# Patient Record
Sex: Female | Born: 1985 | Marital: Married | Smoking: Never smoker
Health system: Southern US, Community
[De-identification: ages and names within clinical notes are randomized; demographics above are authoritative.]

## PROBLEM LIST (undated history)

## (undated) DIAGNOSIS — Z789 Other specified health status: Secondary | ICD-10-CM

## (undated) HISTORY — PX: NO PAST SURGERIES: SHX2092

---

## 2013-07-06 LAB — OB RESULTS CONSOLE GBS: GBS: NEGATIVE

## 2013-12-29 LAB — OB RESULTS CONSOLE RUBELLA ANTIBODY, IGM: Rubella: IMMUNE

## 2013-12-29 LAB — OB RESULTS CONSOLE GC/CHLAMYDIA
Chlamydia: NEGATIVE
GC PROBE AMP, GENITAL: NEGATIVE

## 2013-12-29 LAB — OB RESULTS CONSOLE ABO/RH: RH Type: POSITIVE

## 2013-12-29 LAB — OB RESULTS CONSOLE RPR: RPR: NONREACTIVE

## 2013-12-29 LAB — OB RESULTS CONSOLE VARICELLA ZOSTER ANTIBODY, IGG: Varicella: NON-IMMUNE/NOT IMMUNE

## 2013-12-29 LAB — OB RESULTS CONSOLE HIV ANTIBODY (ROUTINE TESTING): HIV: NONREACTIVE

## 2013-12-29 LAB — OB RESULTS CONSOLE HEPATITIS B SURFACE ANTIGEN: Hepatitis B Surface Ag: NEGATIVE

## 2014-02-27 NOTE — L&D Delivery Note (Signed)
Obstetrical Delivery Note   Date of Delivery:   07/30/2014 at 0845 Primary OB:   [facility] Gestational Age/EDD: 3872w4d  Antepartum complications: preeclampsia with severe features  Delivered By:   Farrel ConnersGUTIERREZ, Yajaira Doffing, CNM  Delivery Type:   spontaneous vaginal delivery  Procedure Details:   SVD of viable female infant in OA with short cord after a three hour second stage.Placed baby on mother's abdomen and cord clamped and cut after pulsations ceased. Baby skin to skin with mother. Placenta delivered spontaneously intact with 3 vessel cord. Uterine atony followed and bimanual, IV Pitocin bolus, and 800 mcg Cytotec helped with hemostasis. Foley reinserted to keep bladder deflated. Uterus explored x2 and clots removed from uterus. Kefzol 2 GM administered IV pp. EBL>55800ml. See laceration regarding repair Anesthesia:    epidural Intrapartum complications: preeclampsia with severe features including liver enzyme elevations and some blood pressures in the severe range. Was given apresoline in second stage x1 GBS:    negative Laceration:    Partial 3rd degree-repaired with 2-0 Vicryl, and second degree repaired with 2-0 and 3-0 Chromic in layers Episiotomy:    none Placenta:    Via active 3rd stage. To pathology: no Estimated Blood Loss:  >500 ml  Baby:    Liveborn female, Apgars 8/9, weight 6-10    Ceola Para, CNM

## 2014-07-13 ENCOUNTER — Inpatient Hospital Stay (HOSPITAL_COMMUNITY): Admission: AD | Admit: 2014-07-13 | Payer: Self-pay | Source: Ambulatory Visit | Admitting: Obstetrics & Gynecology

## 2014-07-28 ENCOUNTER — Inpatient Hospital Stay
Admission: EM | Admit: 2014-07-28 | Discharge: 2014-08-02 | DRG: 988 | Disposition: A | Discharge status: Home / Self Care | Payer: PRIVATE HEALTH INSURANCE | Attending: Obstetrics & Gynecology | Admitting: Obstetrics & Gynecology

## 2014-07-28 ENCOUNTER — Encounter: Payer: Self-pay | Admitting: *Deleted

## 2014-07-28 DIAGNOSIS — O139 Gestational [pregnancy-induced] hypertension without significant proteinuria, unspecified trimester: Secondary | ICD-10-CM | POA: Diagnosis present

## 2014-07-28 DIAGNOSIS — O1413 Severe pre-eclampsia, third trimester: Secondary | ICD-10-CM | POA: Diagnosis not present

## 2014-07-28 DIAGNOSIS — O133 Gestational [pregnancy-induced] hypertension without significant proteinuria, third trimester: Secondary | ICD-10-CM | POA: Diagnosis present

## 2014-07-28 DIAGNOSIS — Z3A37 37 weeks gestation of pregnancy: Secondary | ICD-10-CM | POA: Diagnosis present

## 2014-07-28 DIAGNOSIS — O1490 Unspecified pre-eclampsia, unspecified trimester: Secondary | ICD-10-CM | POA: Diagnosis present

## 2014-07-28 HISTORY — DX: Other specified health status: Z78.9

## 2014-07-28 LAB — CBC
HCT: 44.1 % (ref 35.0–47.0)
Hemoglobin: 14.6 g/dL (ref 12.0–16.0)
MCH: 30.1 pg (ref 26.0–34.0)
MCHC: 33 g/dL (ref 32.0–36.0)
MCV: 91.2 fL (ref 80.0–100.0)
Platelets: 251 10*3/uL (ref 150–440)
RBC: 4.83 MIL/uL (ref 3.80–5.20)
RDW: 13.2 % (ref 11.5–14.5)
WBC: 10.2 10*3/uL (ref 3.6–11.0)

## 2014-07-28 LAB — URINALYSIS COMPLETE WITH MICROSCOPIC (ARMC ONLY)
BILIRUBIN URINE: NEGATIVE
Glucose, UA: NEGATIVE mg/dL
HGB URINE DIPSTICK: NEGATIVE
KETONES UR: NEGATIVE mg/dL
Nitrite: NEGATIVE
PH: 7 (ref 5.0–8.0)
PROTEIN: NEGATIVE mg/dL
SPECIFIC GRAVITY, URINE: 1.008 (ref 1.005–1.030)

## 2014-07-28 LAB — PROTEIN / CREATININE RATIO, URINE
CREATININE, URINE: 54 mg/dL
Protein Creatinine Ratio: 0.13 mg/mg{Cre} (ref 0.00–0.15)
TOTAL PROTEIN, URINE: 7 mg/dL

## 2014-07-28 MED ORDER — LACTATED RINGERS IV SOLN
INTRAVENOUS | Status: DC
  Administered 2014-07-28 – 2014-07-30 (×5): via INTRAVENOUS

## 2014-07-28 MED ORDER — LABETALOL HCL 5 MG/ML IV SOLN: 40.0000 mg | Freq: Once | INTRAVENOUS | Status: AC | PRN

## 2014-07-28 MED ORDER — NIFEDIPINE 10 MG PO CAPS
10.0000 mg | ORAL_CAPSULE | ORAL | Status: DC | PRN
  Filled 2014-07-28: qty 2

## 2014-07-28 MED ORDER — DINOPROSTONE 10 MG VA INST
10.0000 mg | VAGINAL_INSERT | Freq: Once | VAGINAL | Status: AC
  Administered 2014-07-28: 10 mg via VAGINAL
  Filled 2014-07-28: qty 1

## 2014-07-28 MED ORDER — OXYTOCIN 40 UNITS IN LACTATED RINGERS INFUSION - SIMPLE MED
1.0000 m[IU]/min | INTRAVENOUS | Status: DC
Start: 2014-07-28 — End: 2014-07-30
  Administered 2014-07-29: 7 m[IU]/min via INTRAVENOUS
  Administered 2014-07-29: 13 m[IU]/min via INTRAVENOUS
  Administered 2014-07-29: 1 m[IU]/min via INTRAVENOUS
  Administered 2014-07-29: 3 m[IU]/min via INTRAVENOUS
  Administered 2014-07-29: 5 m[IU]/min via INTRAVENOUS

## 2014-07-28 MED ORDER — TERBUTALINE SULFATE 1 MG/ML IJ SOLN: 0.2500 mg | Freq: Once | INTRAMUSCULAR | Status: AC | PRN

## 2014-07-28 NOTE — OB Triage Note (Signed)
Recvd from office with c/o increased blood pressure.  Changed to gown and to bed.  EFM applied.

## 2014-07-28 NOTE — H&P (Signed)
Maria Herman is a 29 y.o. female G1P0 at 37 weeks, 2 days by LMP with EDC of 08/16/14.  Patient was sent to triage for evaluation from office for high blood pressure in third trimester.  On exam today she was found to have a BP of 143/101 with recheck of 146/105 at 12:45pm.  She denies HA, SOB, epigastric/RUQ pain, or change in vision.  She presented to triage, and repeat pressures have been persistently elevated to mild range, for >4hours.  At this time, she was counseled that induction of labor at term was recommended, and she and her husband agreed to proceed.    No CP F/Herman, N/V or leg pain  Maternal Medical History:  Fetal activity: Perceived fetal activity is normal.   Last perceived fetal movement was within the past hour.    Prenatal complications: PIH.     OB History    Gravida Para Term Preterm AB TAB SAB Ectopic Multiple Living   1              Past Medical History  Diagnosis Date  . Medical history non-contributory    Past Surgical History  Procedure Laterality Date  . No past surgeries     Family History: family history is not on file. Social History:  reports that she has never smoked. She does not have any smokeless tobacco history on file. She reports that she does not drink alcohol or use illicit drugs.   Prenatal Transfer Tool  Maternal Diabetes: No Genetic Screening: Declined Maternal Ultrasounds/Referrals: Normal Fetal Ultrasounds or other Referrals:  None Maternal Substance Abuse:  No Significant Maternal Medications:  None Significant Maternal Lab Results:  None Other Comments:  Gestational Hypertension diagnosed on admission  ROS: neg as indicated in HPI  Dilation: 1.5 Effacement (%): 40 Station: -3 Exam by:: CW, cephalic confirmed by ultrasound Blood pressure 134/88, pulse 75, temperature 98.2 F (36.8 Herman), temperature source Oral, resp. rate 16, height 5' (1.524 m), weight 59.875 kg (132 lb), last menstrual period 11/09/2013, unknown if currently  breastfeeding. Exam Physical Exam  Prenatal labs: ABO, Rh:  A+ Antibody:  neg Rubella:  immune RPR:   non-reactive HBsAg:   negative HIV:   negative GBS:   negative Tdap UTD Varicella NON-IMMUNE  Assessment/Plan:  1. Admit to labor and delivery with plan for cervical ripening followed by induction of labor. 2. Send labs to rule in/out pre-eclampsia.  No sign or symptoms of pre-eclampsia at this time. 3. Cervadil to vagina/cervix after patient eats and/or showers.   4. Clear diet 5. Continuous EFM/Toco 6. Maintain IV access  Ward, Maria Herman 07/28/2014, 7:19 PM

## 2014-07-29 ENCOUNTER — Encounter: Payer: Self-pay | Admitting: *Deleted

## 2014-07-29 ENCOUNTER — Inpatient Hospital Stay: Payer: PRIVATE HEALTH INSURANCE | Admitting: Anesthesiology

## 2014-07-29 LAB — TYPE AND SCREEN
ABO/RH(D): A POS
Antibody Screen: NEGATIVE

## 2014-07-29 LAB — COMPREHENSIVE METABOLIC PANEL
ALBUMIN: 2.9 g/dL — AB (ref 3.5–5.0)
ALK PHOS: 219 U/L — AB (ref 38–126)
ALK PHOS: 227 U/L — AB (ref 38–126)
ALT: 105 U/L — ABNORMAL HIGH (ref 14–54)
ALT: 120 U/L — AB (ref 14–54)
ALT: 91 U/L — ABNORMAL HIGH (ref 14–54)
AST: 111 U/L — ABNORMAL HIGH (ref 15–41)
AST: 85 U/L — AB (ref 15–41)
AST: 91 U/L — AB (ref 15–41)
Albumin: 2.7 g/dL — ABNORMAL LOW (ref 3.5–5.0)
Albumin: 3 g/dL — ABNORMAL LOW (ref 3.5–5.0)
Alkaline Phosphatase: 213 U/L — ABNORMAL HIGH (ref 38–126)
Anion gap: 11 (ref 5–15)
Anion gap: 7 (ref 5–15)
Anion gap: 9 (ref 5–15)
BILIRUBIN TOTAL: 0.5 mg/dL (ref 0.3–1.2)
BILIRUBIN TOTAL: 1 mg/dL (ref 0.3–1.2)
BUN: 5 mg/dL — ABNORMAL LOW (ref 6–20)
BUN: 6 mg/dL (ref 6–20)
BUN: 7 mg/dL (ref 6–20)
CO2: 23 mmol/L (ref 22–32)
CO2: 23 mmol/L (ref 22–32)
CO2: 23 mmol/L (ref 22–32)
Calcium: 8.2 mg/dL — ABNORMAL LOW (ref 8.9–10.3)
Calcium: 8.9 mg/dL (ref 8.9–10.3)
Calcium: 9 mg/dL (ref 8.9–10.3)
Chloride: 103 mmol/L (ref 101–111)
Chloride: 105 mmol/L (ref 101–111)
Chloride: 106 mmol/L (ref 101–111)
Creatinine, Ser: 0.58 mg/dL (ref 0.44–1.00)
Creatinine, Ser: 0.61 mg/dL (ref 0.44–1.00)
Creatinine, Ser: 0.62 mg/dL (ref 0.44–1.00)
GFR calc Af Amer: >60 mL/min (ref >60)
GFR calc Af Amer: >60 mL/min (ref >60)
GFR calc non Af Amer: >60 mL/min (ref >60)
GFR calc non Af Amer: >60 mL/min (ref >60)
GLUCOSE: 108 mg/dL — AB (ref 65–99)
GLUCOSE: 124 mg/dL — AB (ref 65–99)
Glucose, Bld: 154 mg/dL — ABNORMAL HIGH (ref 65–99)
POTASSIUM: 3.9 mmol/L (ref 3.5–5.1)
Potassium: 3.3 mmol/L — ABNORMAL LOW (ref 3.5–5.1)
Potassium: 4.2 mmol/L (ref 3.5–5.1)
SODIUM: 139 mmol/L (ref 135–145)
SODIUM: 136 mmol/L (ref 135–145)
Sodium: 135 mmol/L (ref 135–145)
TOTAL PROTEIN: 6.2 g/dL — AB (ref 6.5–8.1)
TOTAL PROTEIN: 6.2 g/dL — AB (ref 6.5–8.1)
Total Bilirubin: 1 mg/dL (ref 0.3–1.2)
Total Protein: 6.8 g/dL (ref 6.5–8.1)

## 2014-07-29 LAB — CBC
HCT: 44.2 % (ref 35.0–47.0)
HCT: 45.7 % (ref 35.0–47.0)
HEMOGLOBIN: 14.6 g/dL (ref 12.0–16.0)
Hemoglobin: 15.2 g/dL (ref 12.0–16.0)
MCH: 30 pg (ref 26.0–34.0)
MCH: 29.9 pg (ref 26.0–34.0)
MCHC: 33 g/dL (ref 32.0–36.0)
MCHC: 33.3 g/dL (ref 32.0–36.0)
MCV: 90.2 fL (ref 80.0–100.0)
MCV: 90.6 fL (ref 80.0–100.0)
PLATELETS: 262 10*3/uL (ref 150–440)
Platelets: 252 10*3/uL (ref 150–440)
RBC: 4.87 MIL/uL (ref 3.80–5.20)
RBC: 5.06 MIL/uL (ref 3.80–5.20)
RDW: 13.3 % (ref 11.5–14.5)
RDW: 13.3 % (ref 11.5–14.5)
WBC: 13.8 10*3/uL — ABNORMAL HIGH (ref 3.6–11.0)
WBC: 16.5 10*3/uL — ABNORMAL HIGH (ref 3.6–11.0)

## 2014-07-29 LAB — ABO/RH: ABO/RH(D): A POS

## 2014-07-29 MED ORDER — BUPIVACAINE HCL (PF) 0.25 % IJ SOLN
INTRAMUSCULAR | Status: DC | PRN
  Administered 2014-07-29: 5 mL via PERINEURAL
  Administered 2014-07-29: 2 mL
  Administered 2014-07-29: 2 mL via PERINEURAL

## 2014-07-29 MED ORDER — FENTANYL 2.5 MCG/ML W/ROPIVACAINE 0.2% IN NS 100 ML EPIDURAL INFUSION (ARMC-ANES)
EPIDURAL | Status: AC
Start: 2014-07-29 — End: 2014-07-29
  Administered 2014-07-29: 9 mL/h via EPIDURAL
  Filled 2014-07-29: qty 100

## 2014-07-29 MED ORDER — DIPHENHYDRAMINE HCL 50 MG/ML IJ SOLN: 12.5000 mg | INTRAMUSCULAR | Status: DC | PRN

## 2014-07-29 MED ORDER — OXYTOCIN 40 UNITS IN LACTATED RINGERS INFUSION - SIMPLE MED
INTRAVENOUS | Status: AC
  Administered 2014-07-29: 1 m[IU]/min via INTRAVENOUS
  Filled 2014-07-29: qty 1000

## 2014-07-29 MED ORDER — MISOPROSTOL 200 MCG PO TABS
ORAL_TABLET | ORAL | Status: AC
  Filled 2014-07-29: qty 4

## 2014-07-29 MED ORDER — ZOLPIDEM TARTRATE 5 MG PO TABS
ORAL_TABLET | ORAL | Status: AC
  Administered 2014-07-29: 5 mg
  Filled 2014-07-29: qty 1

## 2014-07-29 MED ORDER — TERBUTALINE SULFATE 1 MG/ML IJ SOLN: 0.2500 mg | Freq: Once | INTRAMUSCULAR | Status: AC | PRN

## 2014-07-29 MED ORDER — AMMONIA AROMATIC IN INHA
RESPIRATORY_TRACT | Status: AC
  Filled 2014-07-29: qty 10

## 2014-07-29 MED ORDER — MAGNESIUM SULFATE 4 GM/100ML IV SOLN
INTRAVENOUS | Status: AC
  Filled 2014-07-29: qty 100

## 2014-07-29 MED ORDER — FENTANYL 2.5 MCG/ML W/ROPIVACAINE 0.2% IN NS 100 ML EPIDURAL INFUSION (ARMC-ANES)
9.0000 mL/h | EPIDURAL | Status: DC
  Administered 2014-07-30: 9 mL/h via EPIDURAL

## 2014-07-29 MED ORDER — ZOLPIDEM TARTRATE 5 MG PO TABS: 5.0000 mg | ORAL_TABLET | Freq: Every evening | ORAL | Status: DC | PRN

## 2014-07-29 MED ORDER — MAGNESIUM SULFATE 50 % IJ SOLN
2.0000 g/h | INTRAVENOUS | Status: DC
  Administered 2014-07-29: 2 g/h via INTRAVENOUS
  Filled 2014-07-29 (×2): qty 80

## 2014-07-29 MED ORDER — EPHEDRINE 5 MG/ML INJ: 10.0000 mg | INTRAVENOUS | Status: DC | PRN

## 2014-07-29 MED ORDER — CALCIUM GLUCONATE 10 % IV SOLN
INTRAVENOUS | Status: AC
  Filled 2014-07-29: qty 10

## 2014-07-29 MED ORDER — LABETALOL HCL 5 MG/ML IV SOLN: 40.0000 mg | Freq: Once | INTRAVENOUS | Status: AC | PRN

## 2014-07-29 MED ORDER — MAGNESIUM SULFATE BOLUS VIA INFUSION
4.0000 g | Freq: Once | INTRAVENOUS | Status: AC
  Administered 2014-07-29: 4 g via INTRAVENOUS
  Filled 2014-07-29: qty 500

## 2014-07-29 MED ORDER — LIDOCAINE-EPINEPHRINE (PF) 1.5 %-1:200000 IJ SOLN
INTRAMUSCULAR | Status: DC | PRN
  Administered 2014-07-29: 2 mL via PERINEURAL

## 2014-07-29 MED ORDER — PHENYLEPHRINE 40 MCG/ML (10ML) SYRINGE FOR IV PUSH (FOR BLOOD PRESSURE SUPPORT): 80.0000 ug | PREFILLED_SYRINGE | INTRAVENOUS | Status: DC | PRN

## 2014-07-29 MED ORDER — BUTORPHANOL TARTRATE 1 MG/ML IJ SOLN: 1.0000 mg | INTRAMUSCULAR | Status: DC | PRN

## 2014-07-29 MED ORDER — LIDOCAINE HCL (PF) 1 % IJ SOLN
INTRAMUSCULAR | Status: AC
  Filled 2014-07-29: qty 30

## 2014-07-29 NOTE — Progress Notes (Signed)
   Subjective:  Pt comfortable with epidural in place.   Objective: BP 108/68 mmHg  Pulse 84  Temp(Src) 98.6 F (37 C) (Oral)  Resp 16  Ht 5' (1.524 m)  Wt 59.875 kg (132 lb)  BMI 25.78 kg/m2  SpO2 98%  LMP 11/09/2013  Breastfeeding? Unknown I/O last 3 completed shifts: In: 1936.3 [P.O.:240; I.V.:1516.3; Other:180] Out: 50 [Urine:50] Total I/O In: 193 [I.V.:193] Out: 495 [Urine:495]  FHT:  FHR: 150 bpm, variability: moderate,  accelerations: 10x10s present currently,  decelerations:  Absent UC:   Every 3 minutes. IUPC placed  SVE:   3/85-90/-1 AROM for clear fluid   Labs: Lab Results  Component Value Date   WBC 16.5* 07/29/2014   HGB 15.2 07/29/2014   HCT 45.7 07/29/2014   MCV 90.2 07/29/2014   PLT 262 07/29/2014    Assessment / Plan: IOL for pre-eclampsia with severe features  Labor: AROM for clear fluid, continue pitocin  Preeclampsia:  on magnesium sulfate, no signs or symptoms of toxicity, intake and ouput balanced and will repeat labs at 2300 Continue to monitor BP and labs.  Fetal Wellbeing:  Category II Pain Control:  Epidural Anticipated MOD:  NSVD  Maria Herman,  Trinna Kunst 07/29/2014, 11:11 PM

## 2014-07-29 NOTE — Progress Notes (Addendum)
Lab results reviewed with Dr. Jean RosenthalJackson. Pt with criteria for pre-eclampsia with severe features. Magnesium to be started now by RN. Labs to be drawn in 12 hours. Discussed plan of care with the patient and implications for pre eclampsia with severe features during the pregnancy. Pt and her husband verbalizes understanding. Will continue pitocin induction as well as proceed with magnesium drip, lab work, and continue diligent monitoring of the patients clinical status.

## 2014-07-29 NOTE — Anesthesia Procedure Notes (Signed)
Epidural Patient location during procedure: OB Start time: 07/29/2014 8:54 PM End time: 07/29/2014 9:06 PM  Staffing Anesthesiologist: Elijio MilesVAN STAVEREN, Dalani Mette F Performed by: anesthesiologist   Preanesthetic Checklist Completed: patient identified, site marked, surgical consent, pre-op evaluation, timeout performed, IV checked, risks and benefits discussed, monitors and equipment checked and at surgeon's request  Epidural Patient position: sitting Prep: Betadine Patient monitoring: heart rate, continuous pulse ox and blood pressure Approach: midline Location: L3-L4 Injection technique: LOR air and LOR saline  Needle:  Needle type: Tuohy  Needle gauge: 18 G Needle length: 9 cm Needle insertion depth: 5 cm Catheter type: closed end flexible Catheter size: 20 Guage Catheter at skin depth: 9 cm Test dose: 1.5% lidocaine with Epi 1:200 K and negative  Assessment Sensory level: T8  Additional Notes Reason for block:at surgeon's request

## 2014-07-29 NOTE — Care Management Note (Signed)
Review of patient's chart revealed mildly elevated LFTs.  Repeat of LFTs shows increase of AST to > 2x upper-normal limit.  By this criteria (along with elevated BPs), she qualifies as preeclampsia WITH severe features. Will start magnesium per preeclampsia protocol. Discussed with midwife on call. I am available to answer questions for the patient, if necessary.  Will continue to monitor her closely for worsening clinical status.

## 2014-07-29 NOTE — Progress Notes (Signed)
Intrapartum progress note  Patient comfortable, no overnight events, rates contractions about a 4. Denies HA SOB change vision or RUQ/epigastric pain  BP 150/98 mmHg  Pulse 81  Temp(Src) 98.2 F (36.8 C) (Oral)  Resp 16  Ranges: Temp:  [98.2 F (36.8 C)-98.4 F (36.9 C)] 98.2 F (36.8 C) (06/01 0503) Pulse Rate:  [69-88] 81 (06/01 0503) Resp:  [16-20] 16 (06/01 0503) BP: (125-152)/(79-99) 150/98 mmHg (06/01 0503) O2 Sat 100%  FHT: 150 mod +accels, no decels Toco: q2-293min Cervix: deferred until pull cervidil or change in dispo  A/P: 29yo G1P0 @ 37.3 with IOL for GHTN 1. No s/sx preeclampsia 2. Continue cervadil, pull if tachysystole.   3. Category 1 strip  Ranae Plumberhelsea Krisy Dix, MD Attending Obstetrician and Gynecologist [facility]

## 2014-07-29 NOTE — Progress Notes (Signed)
Maria ShireMichelle Herman is a 29 y.o. G1P0 at 3549w3d by admitted for IOL for gestational HTN  Subjective:   Pt reports no pain at this time.    Objective: Filed Vitals:   07/29/14 0503 07/29/14 0748 07/29/14 0850 07/29/14 1017  BP: 150/98 131/71 129/72 142/92  Pulse: 81 87 91 86  Temp: 98.2 F (36.8 C) 97.9 F (36.6 C)    TempSrc: Oral Oral    Resp: 16 18    Height:      Weight:         Total I/O In: 280 [I.V.:280] Out: -   FHT:  FHR: 150 bpm, variability: moderate,  10x10 accelerations, decelerations:  Absent UC:   regular, every 2 minutes Pt flat on her back in the bed. Has been asked to reposition herself on her side but continues to roll on her back.   SVE:   Dilation: 3 Effacement (%): 80 Station: -1 Exam by:: CMS,CNM  Cervidil removed.   Labs: Lab Results  Component Value Date   WBC 10.2 07/28/2014   HGB 14.6 07/28/2014   HCT 44.1 07/28/2014   MCV 91.2 07/28/2014   PLT 251 07/28/2014    Assessment / Plan: Induction of labor due to gestational hypertension  Labor: Will start pitocin after pt showers Preeclampsia:  LFTs elevated. WIll repeat labs now.  Fetal Wellbeing:  Category II Pain Control:  plans epidural when in active labor Anticipated MOD:  NSVD  Continue to monitor blood pressures and s/sx of pre-eclampsia   Marty Uy,  Jerriann Schrom 07/29/2014, 10:34 AM

## 2014-07-29 NOTE — Anesthesia Preprocedure Evaluation (Addendum)
Anesthesia Evaluation  Patient identified by MRN, date of birth, ID band Patient awake    Reviewed: Allergy & Precautions, NPO status , Patient's Chart, lab work & pertinent test results  History of Anesthesia Complications Negative for: history of anesthetic complications  Airway Mallampati: II       Dental no notable dental hx.    Pulmonary neg pulmonary ROS,    Pulmonary exam normal       Cardiovascular hypertension, Normal cardiovascular exam    Neuro/Psych negative neurological ROS     GI/Hepatic negative GI ROS, Neg liver ROS,   Endo/Other    Renal/GU negative Renal ROS  negative genitourinary   Musculoskeletal negative musculoskeletal ROS (+)   Abdominal Normal abdominal exam  (+)   Peds negative pediatric ROS (+)  Hematology negative hematology ROS (+)   Anesthesia Other Findings   Reproductive/Obstetrics negative OB ROS                             Anesthesia Physical Anesthesia Plan  ASA: II  Anesthesia Plan: Spinal   Post-op Pain Management:    Induction:   Airway Management Planned: Nasal Cannula  Additional Equipment:   Intra-op Plan:   Post-operative Plan:   Informed Consent: I have reviewed the patients History and Physical, chart, labs and discussed the procedure including the risks, benefits and alternatives for the proposed anesthesia with the patient or authorized representative who has indicated his/her understanding and acceptance.     Plan Discussed with: CRNA  Anesthesia Plan Comments:         Anesthesia Quick Evaluation

## 2014-07-30 DIAGNOSIS — O1490 Unspecified pre-eclampsia, unspecified trimester: Secondary | ICD-10-CM | POA: Diagnosis present

## 2014-07-30 LAB — COMPREHENSIVE METABOLIC PANEL
ALT: 96 U/L — AB (ref 14–54)
ANION GAP: 8 (ref 5–15)
AST: 92 U/L — ABNORMAL HIGH (ref 15–41)
Albumin: 2.2 g/dL — ABNORMAL LOW (ref 3.5–5.0)
Alkaline Phosphatase: 181 U/L — ABNORMAL HIGH (ref 38–126)
BILIRUBIN TOTAL: 0.7 mg/dL (ref 0.3–1.2)
BUN: 5 mg/dL — ABNORMAL LOW (ref 6–20)
CALCIUM: 7.4 mg/dL — AB (ref 8.9–10.3)
CHLORIDE: 99 mmol/L — AB (ref 101–111)
CO2: 23 mmol/L (ref 22–32)
Creatinine, Ser: 0.63 mg/dL (ref 0.44–1.00)
GFR calc Af Amer: >60 mL/min (ref >60)
GFR calc non Af Amer: >60 mL/min (ref >60)
Glucose, Bld: 170 mg/dL — ABNORMAL HIGH (ref 65–99)
Potassium: 4 mmol/L (ref 3.5–5.1)
SODIUM: 130 mmol/L — AB (ref 135–145)
Total Protein: 5.1 g/dL — ABNORMAL LOW (ref 6.5–8.1)

## 2014-07-30 LAB — RPR: RPR Ser Ql: NONREACTIVE

## 2014-07-30 LAB — CBC
HCT: 38.5 % (ref 35.0–47.0)
Hemoglobin: 13 g/dL (ref 12.0–16.0)
MCH: 30.4 pg (ref 26.0–34.0)
MCHC: 33.8 g/dL (ref 32.0–36.0)
MCV: 89.9 fL (ref 80.0–100.0)
Platelets: 252 10*3/uL (ref 150–440)
RBC: 4.29 MIL/uL (ref 3.80–5.20)
RDW: 13 % (ref 11.5–14.5)
WBC: 24.3 10*3/uL — AB (ref 3.6–11.0)

## 2014-07-30 LAB — MAGNESIUM: MAGNESIUM: 3.5 mg/dL — AB (ref 1.7–2.4)

## 2014-07-30 MED ORDER — CEFAZOLIN SODIUM-DEXTROSE 2-3 GM-% IV SOLR
INTRAVENOUS | Status: AC
  Filled 2014-07-30: qty 50

## 2014-07-30 MED ORDER — HYDRALAZINE HCL 20 MG/ML IJ SOLN
5.0000 mg | INTRAMUSCULAR | Status: DC | PRN
  Administered 2014-07-30: 5 mg via INTRAVENOUS

## 2014-07-30 MED ORDER — HYDROCODONE-ACETAMINOPHEN 5-325 MG PO TABS: 1.0000 | ORAL_TABLET | ORAL | Status: DC | PRN

## 2014-07-30 MED ORDER — FENTANYL 2.5 MCG/ML W/ROPIVACAINE 0.2% IN NS 100 ML EPIDURAL INFUSION (ARMC-ANES)
EPIDURAL | Status: AC
  Filled 2014-07-30: qty 100

## 2014-07-30 MED ORDER — MISOPROSTOL 200 MCG PO TABS
800.0000 ug | ORAL_TABLET | Freq: Once | ORAL | Status: AC | PRN
  Administered 2014-07-30: 800 ug via RECTAL

## 2014-07-30 MED ORDER — OXYTOCIN 40 UNITS IN LACTATED RINGERS INFUSION - SIMPLE MED
INTRAVENOUS | Status: AC
  Administered 2014-07-30: 40 [IU]
  Filled 2014-07-30: qty 1000

## 2014-07-30 MED ORDER — CEFAZOLIN SODIUM-DEXTROSE 2-3 GM-% IV SOLR
2.0000 g | Freq: Once | INTRAVENOUS | Status: AC
  Administered 2014-07-30: 2 g via INTRAVENOUS

## 2014-07-30 MED ORDER — LABETALOL HCL 5 MG/ML IV SOLN: 20.0000 mg | INTRAVENOUS | Status: DC | PRN

## 2014-07-30 MED ORDER — HYDRALAZINE HCL 20 MG/ML IJ SOLN
INTRAMUSCULAR | Status: AC
  Filled 2014-07-30: qty 1

## 2014-07-30 NOTE — Lactation Note (Signed)
This note was copied from the chart of Maria Herman. Lactation Consultation Note  Patient Name: Maria Verdie ShireMichelle Fuson [id number]'UToday's Date: 07/30/2014 Reason for consult: Initial assessment was difficulty to latch. New mother, no breast feeding experience on Mag.   Maternal Data Formula Feeding for Exclusion: No Has patient been taught Hand Expression?: No Does the patient have breastfeeding experience prior to this delivery?: No  Feeding Feeding Type: Breast Fed  LATCH Score/Interventions Latch: Grasps breast easily, tongue down, lips flanged, rhythmical sucking. (baby is irritable) Intervention(s): Adjust position;Assist with latch;Breast massage;Breast compression  Audible Swallowing: A few with stimulation Intervention(s): Alternate breast massage  Type of Nipple: Everted at rest and after stimulation  Comfort (Breast/Nipple): Soft / non-tender     Hold (Positioning): Full assist, staff holds infant at breast Intervention(s): Breastfeeding basics reviewed  LATCH Score: 7       Consult Status  Ongoing PRN    Trudee GripCarolyn P Todrick Siedschlag 07/30/2014, 4:35 PM

## 2014-07-30 NOTE — Progress Notes (Signed)
   Subjective:  Pt comfortable with epidural   Objective: BP 129/74 mmHg  Pulse 94  Temp(Src) 98.5 F (36.9 C) (Oral)  Resp 16  Ht 5' (1.524 m)  Wt 59.875 kg (132 lb)  BMI 25.78 kg/m2  SpO2 98%  LMP 11/09/2013  Breastfeeding? Unknown I/O last 3 completed shifts: In: 1936.3 [P.O.:240; I.V.:1516.3; Other:180] Out: 50 [Urine:50] Total I/O In: 353 [I.V.:353] Out: 695 [Urine:695]  FHT:  FHR: 145 bpm, variability: moderate,  accelerations:  Present,  decelerations:  Absent UC:   regular, every 2 minutes SVE:   Dilation: 5 Effacement (%): 100 Station: -1 Exam by:: L Patterson RNC   FSE placed to monitor fetal heart rate as baby is hard to keep up with on the monitor.   Labs: Lab Results  Component Value Date   WBC 13.8* 07/29/2014   HGB 14.6 07/29/2014   HCT 44.2 07/29/2014   MCV 90.6 07/29/2014   PLT 252 07/29/2014    Assessment / Plan: IOL for pre-eclampsia with severe features  Labor: progresing on pitocin. Will continue current plan  Preeclampsia:  on magnesium sulfate Fetal Wellbeing:  Category I Pain Control:  Epidural I/D:  n/a Anticipated MOD:  NSVD  Shadae Reino,  Lorenzo Pereyra 07/30/2014, 3:10 AM

## 2014-07-31 MED ORDER — IBUPROFEN 600 MG PO TABS
600.0000 mg | ORAL_TABLET | Freq: Four times a day (QID) | ORAL | Status: DC | PRN
  Administered 2014-08-01 – 2014-08-02 (×4): 600 mg via ORAL
  Filled 2014-07-31 (×4): qty 1

## 2014-07-31 MED ORDER — FERROUS SULFATE 325 (65 FE) MG PO TABS
ORAL_TABLET | ORAL | Status: AC
  Administered 2014-07-31: 325 mg via ORAL
  Filled 2014-07-31: qty 1

## 2014-07-31 MED ORDER — LANOLIN HYDROUS EX OINT: TOPICAL_OINTMENT | CUTANEOUS | Status: DC | PRN

## 2014-07-31 MED ORDER — SENNOSIDES-DOCUSATE SODIUM 8.6-50 MG PO TABS
2.0000 | ORAL_TABLET | ORAL | Status: DC
  Administered 2014-07-31 – 2014-08-02 (×3): 2 via ORAL
  Filled 2014-07-31 (×3): qty 2

## 2014-07-31 MED ORDER — HYDROCODONE-ACETAMINOPHEN 5-325 MG PO TABS: 1.0000 | ORAL_TABLET | ORAL | Status: DC | PRN

## 2014-07-31 MED ORDER — FERROUS SULFATE 325 (65 FE) MG PO TABS
325.0000 mg | ORAL_TABLET | Freq: Every day | ORAL | Status: DC
  Administered 2014-07-31 – 2014-08-02 (×3): 325 mg via ORAL
  Filled 2014-07-31 (×2): qty 1

## 2014-07-31 MED ORDER — PRENATAL MULTIVITAMIN CH
1.0000 | ORAL_TABLET | Freq: Every day | ORAL | Status: DC
  Administered 2014-07-31 – 2014-08-02 (×2): 1 via ORAL
  Filled 2014-07-31 (×2): qty 1

## 2014-07-31 MED ORDER — OXYTOCIN 40 UNITS IN LACTATED RINGERS INFUSION - SIMPLE MED: 62.5000 mL/h | INTRAVENOUS | Status: DC | PRN

## 2014-07-31 NOTE — Anesthesia Postprocedure Evaluation (Signed)
  Anesthesia Post-op Note  Patient: Maria ShireMichelle Simmerman  Procedure(s) Performed: epidural  Anesthesia type:epidural  Patient location: LDR2  Post pain: Pain level controlled  Post assessment: Post-op Vital signs reviewed, Patient's Cardiovascular Status Stable, Respiratory Function Stable, Patent Airway and No signs of Nausea or vomiting  Post vital signs: Reviewed and stable  Last Vitals:  Filed Vitals:   07/31/14 0700  BP: 126/85  Pulse: 91  Temp:   Resp: 20    Level of consciousness: awake, alert  and patient cooperative  Complications: No apparent anesthesia complications

## 2014-07-31 NOTE — Progress Notes (Signed)
Admit Date: 07/28/2014 Today's Date: 07/31/2014  Post Partum Day 1, VBAC, Preclampsia, s/p Magnesium for 24 hours  Subjective:  no complaints, tolerating PO and feels better today.  We just stopped magnesium after 24 hours.  Denies h/a, blurry vision, epig pain, edema.  Objective: Temp:  [97.3 F (36.3 C)-98.3 F (36.8 C)] 98 F (36.7 C) (06/03 0730) Pulse Rate:  [87-122] 89 (06/03 0849) Resp:  [18-22] 18 (06/03 0730) BP: (120-169)/(9-111) 140/89 mmHg (06/03 0849) SpO2:  [98 %-100 %] 100 % (06/03 0850)  Physical Exam:  General: alert, cooperative and appears stated age Lochia: appropriate Uterine Fundus: firm Incision: none DVT Evaluation: No evidence of DVT seen on physical exam.   Recent Labs  07/29/14 2331 07/30/14 1511  HGB 14.6 13.0  HCT 44.2 38.5    Assessment/Plan: Breastfeeding, Lactation consult and Infant doing well  Monitor BPs today   LOS: 3 days   Julias Mould [facility] 07/31/2014, 9:31 AM

## 2014-08-01 NOTE — Lactation Note (Signed)
This note was copied from the chart of Maria Verdie ShireMichelle Loudon. Lactation Consultation Note  Patient Name: Maria Herman [id number]'s Date: 08/01/2014     Maternal Data  unable to pump enough milk to supplement, only a drop, baby had 10 % wt  Loss, baby's  skin yellow  Feeding Feeding Type: Breast Milk with Formula added Length of feed: 18 min  LATCH Score/Interventions                      Lactation Tools Discussed/Used Tools: Other (comment) (curved tip syringe) WIC Program: No Pump Review: Setup, frequency, and cleaning Initiated by:: Maria Herman, Maria Herman Date initiated:: 08/01/14   Consult Status Consult Status: PRN Date: 08/02/14 Follow-up type: In-patient    Maria Herman 08/01/2014, 7:52 PM

## 2014-08-01 NOTE — Progress Notes (Signed)
Admit Date: 07/28/2014 Today's Date: 08/01/2014  Post Partum Day 2, VBAC, Preclampsia, s/p Magnesium for 24 hours  Subjective:  no complaints, tolerating PO, voiding, ambulating, no complaints.   Denies h/a, blurry vision, epig pain, edema. No CP SOB F/C N/V or leg pain  Objective: Temp:  [97.4 F (36.3 C)-98 F (36.7 C)] 98 F (36.7 C) (06/04 0742) Pulse Rate:  [73-89] 82 (06/04 0742) Resp:  [18] 18 (06/04 0742) BP: (130-148)/(79-93) 141/83 mmHg (06/04 0742) SpO2:  [99 %-100 %] 99 % (06/04 0742)  Physical Exam:  General: alert, cooperative and appears stated age  CV: RRR Pulm CTABL nl resp effort Lochia: appropriate Uterine Fundus: firm Incision: none DVT Evaluation: No evidence of DVT seen on physical exam.   Recent Labs  07/29/14 2331 07/30/14 1511  HGB 14.6 13.0  HCT 44.2 38.5    Assessment/Plan: Breastfeeding, Lactation consult and Infant doing well  Continue to monitor BPs, no meds if remain mild range. Will need to follow up in 3 days after [facility] Needs varicella vaccine prior to discharge, likely tomorrow.    LOS: 4 days   Isaiah Torok, Elenora Fenderhelsea C [facility] 08/01/2014, 8:11 AM

## 2014-08-01 NOTE — Progress Notes (Signed)
Pt sitting on side of bed breastfeeding at this time. Advised pt to notify RN when breastfeeding complete for BP recheck.

## 2014-08-01 NOTE — Progress Notes (Signed)
Chaplain met with parent and new born, chaplain blessed the baby and provided words of encouragement and prayer for the new fledgling family. Loralyn Freshwater D. Alroy Dust Saturday 08-01-2014 336-5   08/01/14 1500  Clinical Encounter Type  Visited With Patient and family together  Visit Type Initial;Spiritual support  Referral From Nurse  Consult/Referral To Chaplain  Spiritual Encounters  Spiritual Needs Prayer  Stress Factors  Patient Stress Factors None identified  Family Stress Factors None identified  [phone]

## 2014-08-02 MED ORDER — PRENATAL MULTIVITAMIN CH: 1.0000 | ORAL_TABLET | Freq: Every day | ORAL | Status: AC

## 2014-08-02 MED ORDER — IBUPROFEN 600 MG PO TABS: 600.0000 mg | ORAL_TABLET | Freq: Four times a day (QID) | ORAL | Status: AC | PRN

## 2014-08-02 MED ORDER — FERROUS SULFATE 325 (65 FE) MG PO TABS: 325.0000 mg | ORAL_TABLET | Freq: Every day | ORAL | Status: AC

## 2014-08-02 MED ORDER — VARICELLA VIRUS VACCINE LIVE 1350 PFU/0.5ML IJ SUSR: 0.5000 mL | Freq: Once | INTRAMUSCULAR | Status: DC

## 2014-08-02 NOTE — Discharge Summary (Addendum)
Obstetric Discharge Summary Reason for Admission: elevated blood pressures at term Prenatal Procedures: ultrasound Intrapartum Procedures: spontaneous vaginal delivery after induction of labor Postpartum Procedures: varicella IG Complications-Operative and Postpartum: partial 3rd degree perineal laceration HEMOGLOBIN  Date Value Ref Range Status  07/30/2014 13.0 12.0 - 16.0 g/dL Final   HCT  Date Value Ref Range Status  07/30/2014 38.5 35.0 - 47.0 % Final    Physical Exam:  General: alert and no distress  CV RRR Pulm: CTABL nl effort Lochia: appropriate Uterine Fundus: firm  DVT Evaluation: No evidence of DVT seen on physical exam.  Discharge Diagnoses: Term Pregnancy-delivered and Preelampsia  Discharge Information: Date: 08/02/2014 Activity: resume as tolerated Diet: routine Medications: PNV, Ibuprofen and Colace Condition: improved Discharge to: home Follow-up Information    Follow up with GUTIERREZ, COLLEEN, CNM. Schedule an appointment as soon as possible for a visit in 3 days.   Specialty:  Certified Nurse Midwife   Why:  for blood pressure check.   Contact information:   [address] [place] [address] [phone]       Newborn Data: Live born female  Birth Weight: 6 lb 10 oz (3005 g) APGAR: 8, 9  Home with mother.  Maria Herman C 08/02/2014, 8:45 AM

## 2014-08-02 NOTE — Progress Notes (Signed)
Patient understands all discharge instructions and the need to make follow up appointments. Patient discharge via wheelchair with auxillary. 

## 2014-08-02 NOTE — Discharge Instructions (Signed)
Preeclampsia and Eclampsia °Preeclampsia is a serious condition that develops only during pregnancy. It is also called toxemia of pregnancy. This condition causes high blood pressure along with other symptoms, such as swelling and headaches. These may develop as the condition gets worse. Preeclampsia may occur 20 weeks or later into your pregnancy.  °Diagnosing and treating preeclampsia early is very important. If not treated early, it can cause serious problems for you and your baby. One problem it can lead to is eclampsia, which is a condition that causes muscle jerking or shaking (convulsions) in the mother. Delivering your baby is the best treatment for preeclampsia or eclampsia.  °RISK FACTORS °The cause of preeclampsia is not known. You may be more likely to develop preeclampsia if you have certain risk factors. These include:  °· Being pregnant for the first time. °· Having preeclampsia in a past pregnancy. °· Having a family history of preeclampsia. °· Having high blood pressure. °· Being pregnant with twins or triplets. °· Being 35 or older. °· Being African American. °· Having kidney disease or diabetes. °· Having medical conditions such as lupus or blood diseases. °· Being very overweight (obese). °SIGNS AND SYMPTOMS  °The earliest signs of preeclampsia are: °· High blood pressure. °· Increased protein in your urine. Your health care provider will check for this at every prenatal visit. °Other symptoms that can develop include:  °· Severe headaches. °· Sudden weight gain. °· Swelling of your hands, face, legs, and feet. °· Feeling sick to your stomach (nauseous) and throwing up (vomiting). °· Vision problems (blurred or double vision). °· Numbness in your face, arms, legs, and feet. °· Dizziness. °· Slurred speech. °· Sensitivity to bright lights. °· Abdominal pain. °DIAGNOSIS  °There are no screening tests for preeclampsia. Your health care provider will ask you about symptoms and check for signs of  preeclampsia during your prenatal visits. You may also have tests, including: °· Urine testing. °· Blood testing. °· Checking your baby's heart rate. °· Checking the health of your baby and your placenta using images created with sound waves (ultrasound). °TREATMENT  °You can work out the best treatment approach together with your health care provider. It is very important to keep all prenatal appointments. If you have an increased risk of preeclampsia, you may need more frequent prenatal exams. °· Your health care provider may prescribe bed rest. °· You may have to eat as little salt as possible. °· You may need to take medicine to lower your blood pressure if the condition does not respond to more conservative measures. °· You may need to stay in the hospital if your condition is severe. There, treatment will focus on controlling your blood pressure and fluid retention. You may also need to take medicine to prevent seizures. °· If the condition gets worse, your baby may need to be delivered early to protect you and the baby. You may have your labor started with medicine (be induced), or you may have a cesarean delivery. °· Preeclampsia usually goes away after the baby is born. °HOME CARE INSTRUCTIONS  °· Only take over-the-counter or prescription medicines as directed by your health care provider. °· Lie on your left side while resting. This keeps pressure off your baby. °· Elevate your feet while resting. °· Get regular exercise. Ask your health care provider what type of exercise is safe for you. °· Avoid caffeine and alcohol. °· Do not smoke. °· Drink 6-8 glasses of water every day. °· Eat a balanced diet   that is low in salt. Do not add salt to your food.  Avoid stressful situations as much as possible.  Get plenty of rest and sleep.  Keep all prenatal appointments and tests as scheduled. SEEK MEDICAL CARE IF:  You are gaining more weight than expected.  You have any headaches, abdominal pain, or  nausea.  You are bruising more than usual.  You feel dizzy or light-headed. SEEK IMMEDIATE MEDICAL CARE IF:   You develop sudden or severe swelling anywhere in your body. This usually happens in the legs.  You gain 5 lb (2.3 kg) or more in a week.  You have a severe headache, dizziness, problems with your vision, or confusion.  You have severe abdominal pain.  You have lasting nausea or vomiting.  You have a seizure.  You have trouble moving any part of your body.  You develop numbness in your body.  You have trouble speaking.  You have any abnormal bleeding.  You develop a stiff neck.  You pass out. MAKE SURE YOU:   Understand these instructions.  Will watch your condition.  Will get help right away if you are not doing well or get worse. Document Released: 02/11/2000 Document Revised: 02/18/2013 Document Reviewed: 12/06/2012 [facility] Patient Information 2015 [place], [place]. This information is not intended to replace advice given to you by your health care provider. Make sure you discuss any questions you have with your health care provider.  Hypertension During Pregnancy Hypertension is also called high blood pressure. Blood pressure moves blood in your body. Sometimes, the force that moves the blood becomes too strong. When you are pregnant, this condition should be watched carefully. It can cause problems for you and your baby. HOME CARE   Make and keep all of your doctor visits.  Take medicine as told by your doctor. Tell your doctor about all medicines you take.  Eat very little salt.  Exercise regularly.  Do not drink alcohol.  Do not smoke.  Do not have drinks with caffeine.  Lie on your left side when resting.  Your health care provider may ask you to take one low-dose aspirin (81mg ) each day. GET HELP RIGHT AWAY IF:  You have bad belly (abdominal) pain.  You have sudden puffiness (swelling) in the hands, ankles, or face.  You gain 4  pounds (1.8 kilograms) or more in 1 week.  You throw up (vomit) repeatedly.  You have bleeding from the vagina.  You do not feel the baby moving as much.  You have a headache.  You have blurred or double vision.  You have muscle twitching or spasms.  You have shortness of breath.  You have blue fingernails and lips.  You have blood in your pee (urine). MAKE SURE YOU:  Understand these instructions.  Will watch your condition.  Will get help right away if you are not doing well or get worse. Document Released: 03/18/2010 Document Revised: 06/30/2013 Document Reviewed: 09/12/2012 [facility]Exit[facility] Patient Information 2015 [place], [place]. This information is not intended to replace advice given to you by your health care provider. Make sure you discuss any questions you have with your health care provider. Bleeding: Your bleeding could continue up to 6 weeks, the flow should gradually decrease and the color should become dark then lightened over the next couple of weeks. If you notice you are bleeding heavily or passing clots larger than the size of your fist, PLEASE call your physician. No TAMPONS, DOUCHING, ENEMAS OR SEXUAL INTERCOURSE for 6 weeks.  Stitches: Shower daily with mild soap and water. Stitches will dissolve over the next couple of weeks, if you experience any discomfort in the vaginal area you may sit in warm water 15-20 minutes, 3-4 times per day. Just enough water to cover vaginal area.   AfterPains: This is the uterus contracting back to its normal position and size. Use medications prescribed or recommended by your physician to help relieve this discomfort.   Bowels/Hemorrhoids: Drink plenty of water and stay active. Increase fiber, fresh fruits and vegetables in your diet.   Rest/Activity: Rest when the baby is resting  Bathing: Shower daily!  Diet: Continue to eat extra calories until your follow up visit to help replenish nutrients and vitamins. If breastfeeding  eat an extra [phone] and increase your fluid intake to 12 glasses a day.   Contraception: Consult with your physician on what method of birth control you would like to use.   Postpartum "BLUES": It is common to emotional days after delivery, however if it persist for greater than 2 weeks or if you feel concerned please let your physician know immediately. This is hormone driven and nothing you can control so please let someone know how you feel.  Follow Up Visit: Please schedule a follow up visit with your physician.

## 2020-02-04 ENCOUNTER — Other Ambulatory Visit: Payer: Self-pay

## 2020-02-04 ENCOUNTER — Ambulatory Visit (LOCAL_COMMUNITY_HEALTH_CENTER): Payer: BC Managed Care – PPO

## 2020-02-04 DIAGNOSIS — Z23 Encounter for immunization: Secondary | ICD-10-CM | POA: Diagnosis not present

## 2020-02-04 NOTE — Progress Notes (Signed)
Hep B, MMR and Varicella given; tolerated well Aileen Fass, RN

## 2020-03-09 ENCOUNTER — Other Ambulatory Visit: Payer: Self-pay

## 2020-03-09 ENCOUNTER — Ambulatory Visit (LOCAL_COMMUNITY_HEALTH_CENTER): Payer: BC Managed Care – PPO

## 2020-03-09 DIAGNOSIS — Z23 Encounter for immunization: Secondary | ICD-10-CM

## 2020-03-09 DIAGNOSIS — Z111 Encounter for screening for respiratory tuberculosis: Secondary | ICD-10-CM

## 2020-03-09 NOTE — Progress Notes (Signed)
PPD placed today. Hep B #2 and Flu vaccines given without problem. [facility] updated and copy given. Jerel Shepherd, RN

## 2020-03-12 ENCOUNTER — Ambulatory Visit (LOCAL_COMMUNITY_HEALTH_CENTER): Payer: BC Managed Care – PPO

## 2020-03-12 ENCOUNTER — Other Ambulatory Visit: Payer: Self-pay

## 2020-03-12 DIAGNOSIS — Z111 Encounter for screening for respiratory tuberculosis: Secondary | ICD-10-CM

## 2020-03-12 LAB — TB SKIN TEST
Induration: 0 mm
TB Skin Test: NEGATIVE

## 2020-03-30 ENCOUNTER — Ambulatory Visit (LOCAL_COMMUNITY_HEALTH_CENTER): Payer: Self-pay

## 2020-03-30 ENCOUNTER — Other Ambulatory Visit: Payer: Self-pay

## 2020-03-30 DIAGNOSIS — Z111 Encounter for screening for respiratory tuberculosis: Secondary | ICD-10-CM

## 2020-04-02 ENCOUNTER — Ambulatory Visit (LOCAL_COMMUNITY_HEALTH_CENTER): Payer: Self-pay

## 2020-04-02 ENCOUNTER — Other Ambulatory Visit: Payer: Self-pay

## 2020-04-02 VITALS — Wt 110.6 lb

## 2020-04-02 DIAGNOSIS — R7611 Nonspecific reaction to tuberculin skin test without active tuberculosis: Secondary | ICD-10-CM

## 2020-04-02 LAB — TB SKIN TEST
Induration: 10 mm
TB Skin Test: POSITIVE

## 2020-04-02 NOTE — Progress Notes (Signed)
PPDR  10 mm (Positive). Epi completed. Declines HIV and RPR today. Referred for chest xray and Copy of Chest xray order given to pt with instructions. Pt unsure whether she wants LTBI treatment if this is indicated. LTBI vs Active TB explained with info sheet. TB Coord contact card given. Pt plans to get chest xray 04/03/2020 and prefers to be called with results. Pt reports no PCP, but has first PCP appt 05/20/2020 at [facility].  Jerel Shepherd, RN

## 2020-04-03 ENCOUNTER — Ambulatory Visit
Admission: RE | Admit: 2020-04-03 | Discharge: 2020-04-03 | Disposition: A | Discharge status: Home / Self Care | Payer: Self-pay | Source: Ambulatory Visit | Attending: Family Medicine | Admitting: Family Medicine

## 2020-04-03 ENCOUNTER — Ambulatory Visit
Admission: RE | Admit: 2020-04-03 | Discharge: 2020-04-03 | Disposition: A | Discharge status: Home / Self Care | Payer: Self-pay | Attending: Family Medicine | Admitting: Family Medicine

## 2020-04-03 DIAGNOSIS — R7611 Nonspecific reaction to tuberculin skin test without active tuberculosis: Secondary | ICD-10-CM

## 2020-04-05 ENCOUNTER — Telehealth: Payer: Self-pay

## 2020-04-05 DIAGNOSIS — R7612 Nonspecific reaction to cell mediated immunity measurement of gamma interferon antigen response without active tuberculosis: Secondary | ICD-10-CM | POA: Insufficient documentation

## 2020-04-05 NOTE — Telephone Encounter (Signed)
TC with patient. Informed negative CXR. Patient states needs a letter for school.  TB employer letter completed and left at check in desk.  Patient to f/u with TB RN if wants to proceed with LTBI; currently there is a Rifampin shortage.  Richmond Campbell, RN

## 2021-03-23 ENCOUNTER — Telehealth: Payer: Self-pay

## 2021-03-23 DIAGNOSIS — R7612 Nonspecific reaction to cell mediated immunity measurement of gamma interferon antigen response without active tuberculosis: Secondary | ICD-10-CM

## 2021-03-23 NOTE — Telephone Encounter (Signed)
TC with patient.  Discussed Rifampin available for LTBI tx.  Patient states she completed 4 months of Rifampin tx via [facility]. Closed to TB f/u Richmond Campbell, RN

## 2022-05-06 IMAGING — CR DG CHEST 1V
1 series · 1 of 1 positions shown · non-contrast
Comparison: None.

CLINICAL DATA: Positive TB skin test

EXAM:
CHEST  1 VIEW

[dg chest 1 view]
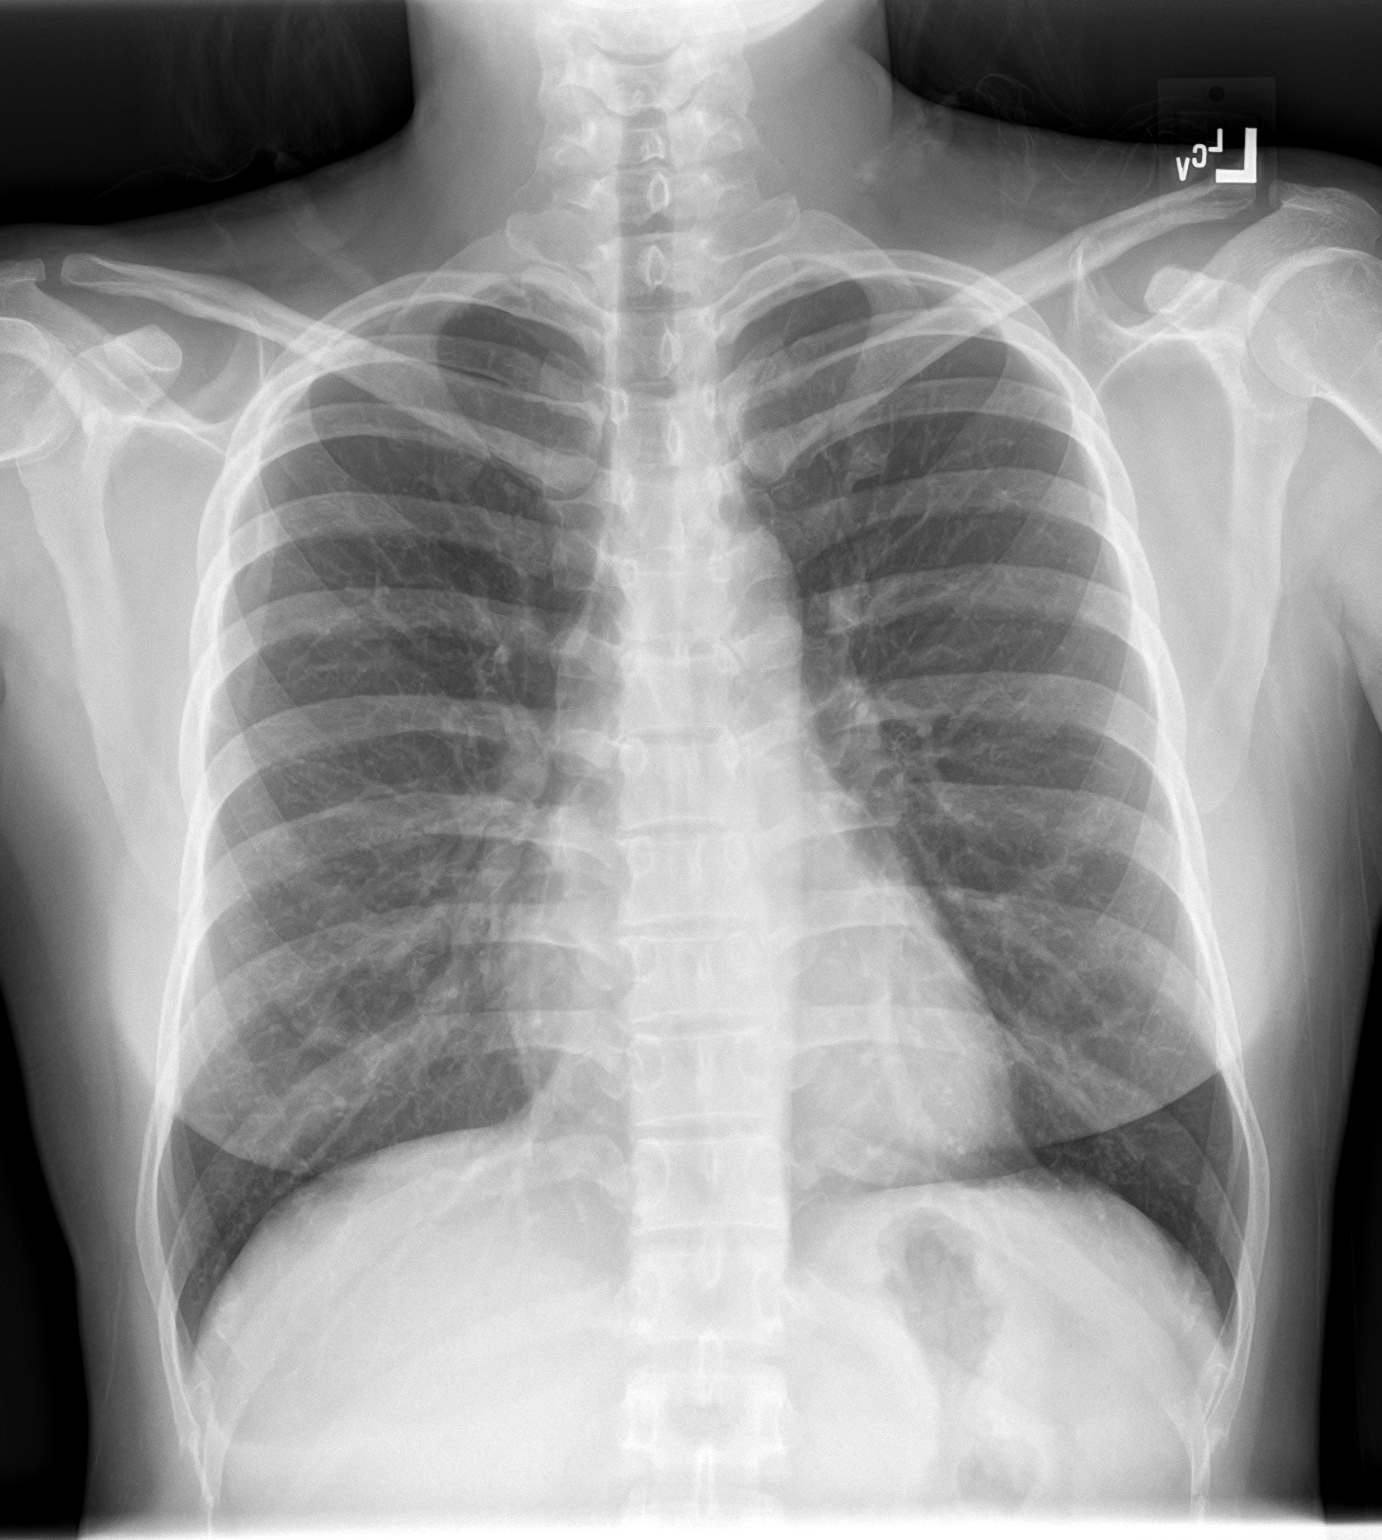

[1 of 1 positions shown; findings below may reference images not displayed]

FINDINGS: The heart size and mediastinal contours are within normal limits.
Both lungs are clear. The visualized skeletal structures are
unremarkable.
IMPRESSION: Negative.

## 2023-06-08 ENCOUNTER — Encounter: Payer: Self-pay | Admitting: Emergency Medicine

## 2023-06-08 ENCOUNTER — Ambulatory Visit: Admission: EM | Admit: 2023-06-08 | Discharge: 2023-06-08 | Disposition: A | Discharge status: Home / Self Care

## 2023-06-08 DIAGNOSIS — T754XXA Electrocution, initial encounter: Secondary | ICD-10-CM | POA: Diagnosis not present

## 2023-06-08 DIAGNOSIS — T23012A Burn of unspecified degree of left thumb (nail), initial encounter: Secondary | ICD-10-CM | POA: Diagnosis not present
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
## 2024-03-19 ENCOUNTER — Ambulatory Visit
Admission: EM | Admit: 2024-03-19 | Discharge: 2024-03-19 | Disposition: A | Discharge status: Home / Self Care | Attending: Physician Assistant | Admitting: Physician Assistant

## 2024-03-19 DIAGNOSIS — J029 Acute pharyngitis, unspecified: Secondary | ICD-10-CM | POA: Diagnosis not present

## 2024-03-19 DIAGNOSIS — R5383 Other fatigue: Secondary | ICD-10-CM | POA: Insufficient documentation

## 2024-03-19 DIAGNOSIS — J069 Acute upper respiratory infection, unspecified: Secondary | ICD-10-CM | POA: Insufficient documentation

## 2024-03-19 LAB — POC SOFIA SARS ANTIGEN FIA: SARS Coronavirus 2 Ag: NEGATIVE

## 2024-03-19 LAB — POCT RAPID STREP A (OFFICE): Rapid Strep A Screen: NEGATIVE

## 2024-03-19 NOTE — Discharge Instructions (Signed)
-
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
## 2024-03-19 NOTE — ED Triage Notes (Signed)
 Pt present sore throat with body aches, symptoms started yesterday. Pt state hurts to swallow a little bit. Pt took OTC medication with no relief.

## 2024-03-23 LAB — CULTURE, GROUP A STREP (THRC)
